# Patient Record
Sex: Male | Born: 1975 | Race: White | Hispanic: No | Marital: Married | Smoking: Former smoker
Health system: Southern US, Community
[De-identification: ages and names within clinical notes are randomized; demographics above are authoritative.]

---

## 1999-03-01 ENCOUNTER — Emergency Department (HOSPITAL_COMMUNITY): Admission: EM | Admit: 1999-03-01 | Discharge: 1999-03-02 | Payer: Self-pay | Admitting: *Deleted

## 2010-07-23 ENCOUNTER — Other Ambulatory Visit: Payer: Self-pay | Admitting: Neurosurgery

## 2010-07-23 DIAGNOSIS — M542 Cervicalgia: Secondary | ICD-10-CM

## 2010-07-23 DIAGNOSIS — M79601 Pain in right arm: Secondary | ICD-10-CM

## 2010-07-25 ENCOUNTER — Ambulatory Visit
Admission: RE | Admit: 2010-07-25 | Discharge: 2010-07-25 | Disposition: A | Discharge status: Home / Self Care | Payer: BC Managed Care – PPO | Source: Ambulatory Visit | Attending: Neurosurgery | Admitting: Neurosurgery

## 2010-07-25 DIAGNOSIS — M79601 Pain in right arm: Secondary | ICD-10-CM

## 2010-07-25 DIAGNOSIS — M542 Cervicalgia: Secondary | ICD-10-CM

## 2010-09-19 ENCOUNTER — Other Ambulatory Visit: Payer: Self-pay | Admitting: Neurosurgery

## 2010-09-19 DIAGNOSIS — M542 Cervicalgia: Secondary | ICD-10-CM

## 2010-09-19 DIAGNOSIS — M79601 Pain in right arm: Secondary | ICD-10-CM

## 2010-09-20 ENCOUNTER — Ambulatory Visit
Admission: RE | Admit: 2010-09-20 | Discharge: 2010-09-20 | Disposition: A | Discharge status: Home / Self Care | Payer: BC Managed Care – PPO | Source: Ambulatory Visit | Attending: Neurosurgery | Admitting: Neurosurgery

## 2010-09-20 DIAGNOSIS — M79601 Pain in right arm: Secondary | ICD-10-CM

## 2010-09-20 DIAGNOSIS — M542 Cervicalgia: Secondary | ICD-10-CM

## 2011-03-11 ENCOUNTER — Other Ambulatory Visit: Payer: Self-pay | Admitting: Neurosurgery

## 2011-03-11 DIAGNOSIS — M542 Cervicalgia: Secondary | ICD-10-CM

## 2011-03-11 DIAGNOSIS — M5020 Other cervical disc displacement, unspecified cervical region: Secondary | ICD-10-CM

## 2011-03-13 ENCOUNTER — Ambulatory Visit
Admission: RE | Admit: 2011-03-13 | Discharge: 2011-03-13 | Disposition: A | Discharge status: Home / Self Care | Payer: BC Managed Care – PPO | Source: Ambulatory Visit | Attending: Neurosurgery | Admitting: Neurosurgery

## 2011-03-13 DIAGNOSIS — M542 Cervicalgia: Secondary | ICD-10-CM

## 2011-03-13 DIAGNOSIS — M5020 Other cervical disc displacement, unspecified cervical region: Secondary | ICD-10-CM

## 2011-03-13 MED ORDER — TRIAMCINOLONE ACETONIDE 40 MG/ML IJ SUSP (RADIOLOGY)
60.0000 mg | Freq: Once | INTRAMUSCULAR | Status: AC
  Administered 2011-03-13: 60 mg via EPIDURAL

## 2011-03-13 MED ORDER — IOHEXOL 300 MG/ML  SOLN
1.0000 mL | Freq: Once | INTRAMUSCULAR | Status: AC | PRN
  Administered 2011-03-13: 1 mL via EPIDURAL

## 2011-08-14 ENCOUNTER — Other Ambulatory Visit: Payer: Self-pay | Admitting: Neurosurgery

## 2011-08-14 DIAGNOSIS — M5020 Other cervical disc displacement, unspecified cervical region: Secondary | ICD-10-CM

## 2011-08-21 ENCOUNTER — Ambulatory Visit
Admission: RE | Admit: 2011-08-21 | Discharge: 2011-08-21 | Disposition: A | Discharge status: Home / Self Care | Payer: BC Managed Care – PPO | Source: Ambulatory Visit | Attending: Neurosurgery | Admitting: Neurosurgery

## 2011-08-21 DIAGNOSIS — M5020 Other cervical disc displacement, unspecified cervical region: Secondary | ICD-10-CM

## 2011-08-21 MED ORDER — TRIAMCINOLONE ACETONIDE 40 MG/ML IJ SUSP (RADIOLOGY)
60.0000 mg | Freq: Once | INTRAMUSCULAR | Status: AC
  Administered 2011-08-21: 60 mg via EPIDURAL

## 2011-08-21 MED ORDER — IOHEXOL 300 MG/ML  SOLN
1.0000 mL | Freq: Once | INTRAMUSCULAR | Status: AC | PRN
  Administered 2011-08-21: 1 mL via EPIDURAL

## 2011-08-21 NOTE — Discharge Instructions (Signed)

## 2014-08-26 DIAGNOSIS — M5090 Cervical disc disorder, unspecified, unspecified cervical region: Secondary | ICD-10-CM | POA: Insufficient documentation

## 2014-09-09 DIAGNOSIS — J301 Allergic rhinitis due to pollen: Secondary | ICD-10-CM | POA: Insufficient documentation

## 2014-09-09 DIAGNOSIS — E785 Hyperlipidemia, unspecified: Secondary | ICD-10-CM | POA: Insufficient documentation

## 2014-10-07 ENCOUNTER — Other Ambulatory Visit: Payer: Self-pay | Admitting: Neurosurgery

## 2014-10-07 DIAGNOSIS — M542 Cervicalgia: Secondary | ICD-10-CM

## 2014-10-12 ENCOUNTER — Ambulatory Visit
Admission: RE | Admit: 2014-10-12 | Discharge: 2014-10-12 | Disposition: A | Discharge status: Home / Self Care | Payer: BLUE CROSS/BLUE SHIELD | Source: Ambulatory Visit | Attending: Neurosurgery | Admitting: Neurosurgery

## 2014-10-12 ENCOUNTER — Other Ambulatory Visit: Payer: Self-pay

## 2014-10-12 DIAGNOSIS — M542 Cervicalgia: Secondary | ICD-10-CM

## 2014-10-12 MED ORDER — TRIAMCINOLONE ACETONIDE 40 MG/ML IJ SUSP (RADIOLOGY)
60.0000 mg | Freq: Once | INTRAMUSCULAR | Status: AC
  Administered 2014-10-12: 60 mg via EPIDURAL

## 2014-10-12 MED ORDER — IOHEXOL 300 MG/ML  SOLN
1.0000 mL | Freq: Once | INTRAMUSCULAR | Status: AC | PRN
  Administered 2014-10-12: 1 mL via EPIDURAL

## 2014-10-12 NOTE — Discharge Instructions (Signed)

## 2015-03-29 ENCOUNTER — Other Ambulatory Visit: Payer: Self-pay | Admitting: Neurosurgery

## 2015-03-29 DIAGNOSIS — M5020 Other cervical disc displacement, unspecified cervical region: Secondary | ICD-10-CM

## 2015-04-06 ENCOUNTER — Ambulatory Visit
Admission: RE | Admit: 2015-04-06 | Discharge: 2015-04-06 | Disposition: A | Discharge status: Home / Self Care | Payer: BLUE CROSS/BLUE SHIELD | Source: Ambulatory Visit | Attending: Neurosurgery | Admitting: Neurosurgery

## 2015-04-06 DIAGNOSIS — M5020 Other cervical disc displacement, unspecified cervical region: Secondary | ICD-10-CM

## 2015-04-06 MED ORDER — TRIAMCINOLONE ACETONIDE 40 MG/ML IJ SUSP (RADIOLOGY)
60.0000 mg | Freq: Once | INTRAMUSCULAR | Status: AC
  Administered 2015-04-06: 60 mg via EPIDURAL

## 2015-04-06 MED ORDER — IOHEXOL 300 MG/ML  SOLN
1.0000 mL | Freq: Once | INTRAMUSCULAR | Status: AC | PRN
  Administered 2015-04-06: 1 mL via EPIDURAL

## 2016-03-06 ENCOUNTER — Other Ambulatory Visit: Payer: Self-pay | Admitting: Nurse Practitioner

## 2016-03-06 DIAGNOSIS — M5020 Other cervical disc displacement, unspecified cervical region: Secondary | ICD-10-CM

## 2016-03-11 ENCOUNTER — Ambulatory Visit
Admission: RE | Admit: 2016-03-11 | Discharge: 2016-03-11 | Disposition: A | Discharge status: Home / Self Care | Payer: BLUE CROSS/BLUE SHIELD | Source: Ambulatory Visit | Attending: Nurse Practitioner | Admitting: Nurse Practitioner

## 2016-03-11 DIAGNOSIS — M5020 Other cervical disc displacement, unspecified cervical region: Secondary | ICD-10-CM

## 2016-03-11 MED ORDER — TRIAMCINOLONE ACETONIDE 40 MG/ML IJ SUSP (RADIOLOGY)
60.0000 mg | Freq: Once | INTRAMUSCULAR | Status: AC
  Administered 2016-03-11: 60 mg via EPIDURAL

## 2016-03-11 MED ORDER — IOPAMIDOL (ISOVUE-M 300) INJECTION 61%
1.0000 mL | Freq: Once | INTRAMUSCULAR | Status: AC | PRN
  Administered 2016-03-11: 1 mL via EPIDURAL

## 2016-03-11 NOTE — Discharge Instructions (Signed)

## 2024-02-19 NOTE — Progress Notes (Signed)
 Subjective   Patient ID:  Derrick Best is a 48 y.o. (DOB 1975-08-02) male    Patient presents with   Annual Exam     Presents today for a complete physical.   Additional concerns:  History of Present Illness The patient is a 48 year old male presenting for a wellness visit. He is on bupropion for anxiety, Zetia, and over-the-counter Flonase, all without adverse effects. He has a history of myalgias and constipation with statins. He has not tried red yeast rice. There have been no hospitalizations in the past year and no new family history. He experiences occasional nocturia after drinking coffee but has no blood in his stool or peripheral edema. He reports occasional postprandial chest tightness that is relieved by burping and is not exertional. He does not have a cough after eating or at night. The patient quit smoking on November 12, 2020, having started at age 35 with an intermittent history and varying quantity. He does not engage in regular exercise due to work commitments and never consumes more than 14 alcoholic drinks per week. He has a history of shingles in the eye around 2016-2017, with no vision loss, and was referred to an optometrist. There was no facial rash or scab on the nose. He noticed a spot under his tongue post-vaccination, which has since resolved. He attends regular dental check-ups but missed his last appointment.  FAMILY HISTORY - No family history of prostate cancer or kidney disease - Granddad had heart attack and stroke due to medication allergy - Mother's side has high cholesterol - No family history of early coronary artery disease or colon cancer - Family history of polyps  Review of Systems   Objective  BP 120/74 (BP Location: Right Upper Arm, Patient Position: Sitting)   Pulse 76   Temp 97.3 F (36.3 C) (Temporal)   Ht 6' (1.829 m)   Wt 186 lb 6.4 oz (84.6 kg)   SpO2 98%   BMI 25.28 kg/m    Physical Exam Vitals reviewed.  HENT:     Head:  Normocephalic and atraumatic.     Right Ear: Tympanic membrane, ear canal and external ear normal.     Left Ear: Tympanic membrane, ear canal and external ear normal.  Eyes:     General: No scleral icterus. Neck:     Vascular: No carotid bruit.  Cardiovascular:     Rate and Rhythm: Normal rate and regular rhythm.  Musculoskeletal:     Right lower leg: No edema.     Left lower leg: No edema.  Pulmonary:     Effort: Pulmonary effort is normal.     Breath sounds: Normal breath sounds.  Abdominal:     Palpations: Abdomen is soft.  Lymphadenopathy:     Cervical: No cervical adenopathy.  Skin:    General: Skin is warm and dry.     Findings: No rash.  Neurological:     General: No focal deficit present.     Mental Status: He is alert and oriented to person, place, and time.  Psychiatric:        Behavior: Behavior normal.        Thought Content: Thought content normal.        Judgment: Judgment normal.     Lab Results  Component Value Date   Cholesterol, Total 240 (H) 10/15/2023   Lab Results  Component Value Date   HDL 43 10/15/2023   Lab Results  Component Value Date   LDL 145 (H)  10/15/2023   Lab Results  Component Value Date   Triglycerides 286 (H) 10/15/2023   No results found for: [facility] Lab Results  Component Value Date   PSA 1.4 10/15/2023     Assessment and Plan     ICD-10-CM   1. Adult wellness visit  Z00.00 Lipid Panel With LDL/HDL Ratio    Basic metabolic panel    Hemoglobin A1c    Hemoglobin A1c    Basic metabolic panel    Lipid Panel With LDL/HDL Ratio    2. Statin intolerance  Z78.9     3. Hyperlipidemia, unspecified hyperlipidemia type  E78.5 ezetimibe (ZETIA) 10 MG tablet    Lipid Panel With LDL/HDL Ratio    Hemoglobin A1c    Hemoglobin A1c    Lipid Panel With LDL/HDL Ratio    4. Generalized anxiety disorder  F41.1 buPROPion hcl (WELLBUTRIN XL) 300 mg 24 hr tablet    5. Hyperlipidemia, unspecified hyperlipidemia type  E78.5  ezetimibe (ZETIA) 10 MG tablet    Lipid Panel With LDL/HDL Ratio    Hemoglobin A1c    Hemoglobin A1c    Lipid Panel With LDL/HDL Ratio   Statin intolerant, treated with Zetia.      Assessment & Plan 1. Wellness visit: - PSA normal few months ago, no immediate retesting needed -ASCVD Risk score 4.7% - BMI 25 - Blood pressure 120/74 today, previously 119/76- WNL.  - Does not meet risk criteria for lung CA screening - No risk for hepatitis C screening - Shingles vaccine prescription to pharmacy - Colonoscopy up-to-date - Order metabolic panel - Advise 150 minutes of physical activity per week - Limit alcohol to 2 drinks per day or less.  - Wear SPF long sleeves, hat, and mineral sunscreen for skin cancer prevention  2. Anxiety: - On bupropion (Wellbutrin), no side effects, condition chronic and stable.  - Refill prescription to pharmacy  3. Hypercholesterolemia: - On Zetia, no side effects - Refill prescription to pharmacy  Follow-up: - In six months  Colon cancer screening: UTD  Immunizations: Send shingles script to pharmacy   Hep C screening: No risk factors  Lipid screening: ordered  Current statin use: No, on Zetia.   Blood pressure screening: Performed, see VS.   Current BP med use: None  Recommend increase regular exercise to 150 minutes weekly.   Mood screening:  Current use of medications: Wellbutrin refilled.   Diabetes screening: Ordered  Alcohol Use screening: Negative  Laymon KATHEE Ancona, DNP   Computer technology was used to create visit note. Consent from patient/caregiver was obtained prior to it's use.

## 2024-04-14 NOTE — Telephone Encounter (Signed)
 Pharmacy requests RX. ERX sent

## 2024-04-19 ENCOUNTER — Other Ambulatory Visit: Payer: Self-pay

## 2024-04-19 ENCOUNTER — Encounter (HOSPITAL_COMMUNITY): Payer: Self-pay | Admitting: Orthopedic Surgery

## 2024-04-19 ENCOUNTER — Ambulatory Visit
Admission: RE | Admit: 2024-04-19 | Discharge: 2024-04-19 | Disposition: A | Discharge status: Home / Self Care | Source: Ambulatory Visit | Attending: Orthopedic Surgery | Admitting: Orthopedic Surgery

## 2024-04-19 ENCOUNTER — Other Ambulatory Visit: Payer: Self-pay | Admitting: Orthopedic Surgery

## 2024-04-19 DIAGNOSIS — S82122D Displaced fracture of lateral condyle of left tibia, subsequent encounter for closed fracture with routine healing: Secondary | ICD-10-CM

## 2024-04-19 NOTE — H&P (Signed)
 PREOPERATIVE H&P  Chief Complaint: LEFT UNICONDYLAR TIBIA PLATEAU FRACTURE  HPI: Derrick Best is a 48 y.o. male who presents with a diagnosis of LEFT UNICONDYLAR TIBIA PLATEAU FRACTURE. Symptoms are rated as moderate to severe, and have been worsening.  This is significantly impairing activities of daily living.  He has elected for surgical management.   Past Medical History:  Diagnosis Date   Anxiety    Past Surgical History:  Procedure Laterality Date   shoulder surgery Right    rotator cuff repair   Social History   Socioeconomic History   Marital status: Married    Spouse name: Not on file   Number of children: Not on file   Years of education: Not on file   Highest education level: Not on file  Occupational History   Not on file  Tobacco Use   Smoking status: Former    Current packs/day: 0.00    Average packs/day: 0.1 packs/day for 2.0 years (0.2 ttl pk-yrs)    Types: Cigarettes    Start date: 10/12/1994    Quit date: 2022    Years since quitting: 3.9   Smokeless tobacco: Never  Vaping Use   Vaping status: Never Used  Substance and Sexual Activity   Alcohol use: Yes    Comment: occas   Drug use: Never   Sexual activity: Not on file  Other Topics Concern   Not on file  Social History Narrative   Not on file   Social Drivers of Health   Tobacco Use: Medium Risk (04/19/2024)   Patient History    Smoking Tobacco Use: Former    Smokeless Tobacco Use: Never    Passive Exposure: Not on file  Financial Resource Strain: Low Risk (02/19/2024)   Received from [facility]   Overall Financial Resource Strain (CARDIA)    How hard is it for you to pay for the very basics like food, housing, medical care, and heating?: Not hard at all  Food Insecurity: No Food Insecurity (02/19/2024)   Received from [facility]   Epic    Within the past 12 months, you worried that your food would run out before you got the money to buy more.: Never true    Within the past 12  months, the food you bought just didn't last and you didn't have money to get more.: Never true  Transportation Needs: No Transportation Needs (02/19/2024)   Received from [facility]    In the past 12 months, has lack of transportation kept you from medical appointments or from getting medications?: No    In the past 12 months, has lack of transportation kept you from meetings, work, or from getting things needed for daily living?: No  Physical Activity: Inactive (02/19/2024)   Received from [facility]   Exercise Vital Sign    On average, how many days per week do you engage in moderate to strenuous exercise (like a brisk walk)?: 0 days    Minutes of Exercise per Session: Not on file  Stress: No Stress Concern Present (02/19/2024)   Received from [facility] Occup[facility] - Occupational Stress Questionnaire    Do you feel stress - tense, restless, nervous, or anxious, or unable to sleep at night because your mind is troubled all the time - these days?: Not at all  Social Connections: Socially Integrated (02/19/2024)   Received from [facility]   Social Network    How would you rate  your social network (family, work, friends)?: Good participation with social networks  Depression (PHQ2-9): Not on file  Alcohol Screen: Not on file  Housing: Low Risk (02/19/2024)   Received from [facility]    In the last 12 months, was there a time when you were not able to pay the mortgage or rent on time?: No    In the past 12 months, how many times have you moved where you were living?: 1    At any time in the past 12 months, were you homeless or living in a shelter (including now)?: No  Utilities: Not At Risk (02/19/2024)   Received from [facility]    In the past 12 months has the electric, gas, oil, or water company threatened to shut off services in your home?: No  Health Literacy: Not on file   History reviewed. No pertinent family  history. Allergies[1] Prior to Admission medications  Medication Sig Start Date End Date Taking? Authorizing Provider  ezetimibe (ZETIA) 10 MG tablet Take 10 mg by mouth. 09/12/14 09/12/15  [provider]  fluticasone OREN) 50 MCG/ACT nasal spray Use 2 sprays each nostril once daily as needed for allergies 09/09/14   [provider]     Positive ROS: All other systems have been reviewed and were otherwise negative with the exception of those mentioned in the HPI and as above.  Physical Exam: General: Alert, no acute distress Cardiovascular: No pedal edema Respiratory: No cyanosis, no use of accessory musculature GI: No organomegaly, abdomen is soft and non-tender Skin: No lesions in the area of chief complaint Neurologic: Sensation intact distally Psychiatric: Patient is competent for consent with normal mood and affect Lymphatic: No axillary or cervical lymphadenopathy  MUSCULOSKELETAL: TTP left knee, limited ROM tolerated, effusion present, NVI   Imaging: xrays show a displaced, depressed lateral tibial plateau fracture   Assessment: LEFT UNICONDYLAR TIBIA PLATEAU FRACTURE  Plan: Plan for Procedures: OPEN REDUCTION INTERNAL FIXATION (ORIF) TIBIAL PLATEAU REPAIR, MENISCUS, KNEE  The risks benefits and alternatives were discussed with the patient including but not limited to the risks of nonoperative treatment, versus surgical intervention including infection, bleeding, nerve injury,  blood clots, cardiopulmonary complications, morbidity, mortality, among others, and they were willing to proceed.   Weightbearing: NWB LLE Orthopedic devices: KI Showering: POD 3 Dressing: reinforce PRN Medicines: ASA, Oxy, Tylenol , Mobic , Baclofen , Zofran   Discharge: home Follow up: 05/03/24 at 11am with me    Gerard CHRISTELLA Ted DEVONNA Office [phone] 04/19/2024 4:57 PM        [1]  Allergies Allergen Reactions   Colesevelam Other (See Comments)     Constipation   Statins Other (See Comments)    Myalgias

## 2024-04-19 NOTE — Progress Notes (Signed)
 SDW call  Patient was given pre-op instructions over the phone. Patient verbalized understanding of instructions provided.  Denies any SOB, fever or cough   PCP - [facility] Cardiologist -  Pulmonary:    PPM/ICD - denies Device Orders - na Rep Notified - na   Chest x-ray -  EKG -   Stress Test - ECHO -  Cardiac Cath -   Sleep Study/sleep apnea/CPAP: denies  Non-diabetic  Blood Thinner Instructions: denies Aspirin  Instructions:denies   ERAS Protcol - Clears until 1100   Anesthesia review: No  Your procedure is scheduled on Tuesday April 20, 2024  Report to [facility] Main Entrance A at   1130 A.M., then check in with the Admitting office.  Call this number if you have problems the morning of surgery:  [phone]   If you have any questions prior to your surgery date call [phone]: Open Monday-Friday 8am-4pm If you experience any cold or flu symptoms such as cough, fever, chills, shortness of breath, etc. between now and your scheduled surgery, please notify us  at the above number     Remember:  Do not eat after midnight the night before your surgery  You may drink clear liquids until  1100   the morning of your surgery.   Clear liquids allowed are: Water, Non-Citrus Juices (without pulp), Carbonated Beverages, Clear Tea, Black Coffee ONLY (NO MILK, CREAM OR POWDERED CREAMER of any kind), and Gatorade   Take these medicines the morning of surgery with A SIP OF WATER:  Wellbutrin, zetia, flonase  As needed: Norco, Saline nasal  As of today, STOP taking any Aspirin  (unless otherwise instructed by your surgeon) Aleve, Naproxen, Ibuprofen, Motrin, Advil, Goody's, BC's, all herbal medications, fish oil, and all vitamins.

## 2024-04-20 ENCOUNTER — Ambulatory Visit (HOSPITAL_COMMUNITY)

## 2024-04-20 ENCOUNTER — Ambulatory Visit (HOSPITAL_COMMUNITY)
Admission: RE | Admit: 2024-04-20 | Discharge: 2024-04-20 | Disposition: A | Discharge status: Home / Self Care | Attending: Orthopedic Surgery | Admitting: Orthopedic Surgery

## 2024-04-20 ENCOUNTER — Ambulatory Visit (HOSPITAL_COMMUNITY): Admitting: Anesthesiology

## 2024-04-20 ENCOUNTER — Encounter: Admission: RE | Disposition: A | Payer: Self-pay | Attending: Orthopedic Surgery

## 2024-04-20 ENCOUNTER — Encounter (HOSPITAL_COMMUNITY): Payer: Self-pay | Admitting: Orthopedic Surgery

## 2024-04-20 DIAGNOSIS — Z87891 Personal history of nicotine dependence: Secondary | ICD-10-CM | POA: Diagnosis not present

## 2024-04-20 DIAGNOSIS — X58XXXA Exposure to other specified factors, initial encounter: Secondary | ICD-10-CM | POA: Diagnosis not present

## 2024-04-20 DIAGNOSIS — S83282A Other tear of lateral meniscus, current injury, left knee, initial encounter: Secondary | ICD-10-CM | POA: Diagnosis not present

## 2024-04-20 DIAGNOSIS — S82142A Displaced bicondylar fracture of left tibia, initial encounter for closed fracture: Secondary | ICD-10-CM | POA: Diagnosis present

## 2024-04-20 HISTORY — PX: ORIF TIBIA FRACTURE: SHX5416

## 2024-04-20 HISTORY — PX: MENISCUS REPAIR: SHX5179

## 2024-04-20 SURGERY — OPEN REDUCTION INTERNAL FIXATION (ORIF) TIBIA FRACTURE
Anesthesia: Regional | Site: Knee | Laterality: Left

## 2024-04-20 MED ORDER — LACTATED RINGERS IV SOLN: INTRAVENOUS | Status: DC

## 2024-04-20 MED ORDER — ORAL CARE MOUTH RINSE: 15.0000 mL | Freq: Once | OROMUCOSAL | Status: AC

## 2024-04-20 MED ORDER — ROPIVACAINE HCL 5 MG/ML IJ SOLN
INTRAMUSCULAR | Status: DC | PRN
  Administered 2024-04-20: 12:00:00 20 mL via PERINEURAL

## 2024-04-20 MED ORDER — MIDAZOLAM HCL 2 MG/2ML IJ SOLN
INTRAMUSCULAR | Status: AC
  Filled 2024-04-20: qty 2

## 2024-04-20 MED ORDER — MIDAZOLAM HCL (PF) 2 MG/2ML IJ SOLN: 2.0000 mg | Freq: Once | INTRAMUSCULAR | Status: AC

## 2024-04-20 MED ORDER — POVIDONE-IODINE 10 % EX SWAB
2.0000 | Freq: Once | CUTANEOUS | Status: AC
  Administered 2024-04-20: 12:00:00 2 via TOPICAL

## 2024-04-20 MED ORDER — MIDAZOLAM HCL 2 MG/2ML IJ SOLN
INTRAMUSCULAR | Status: AC
  Administered 2024-04-20: 12:00:00 2 mg via INTRAVENOUS
  Filled 2024-04-20: qty 2

## 2024-04-20 MED ORDER — FENTANYL CITRATE (PF) 100 MCG/2ML IJ SOLN: 50.0000 ug | Freq: Once | INTRAMUSCULAR | Status: AC

## 2024-04-20 MED ORDER — ROCURONIUM BROMIDE 10 MG/ML (PF) SYRINGE
PREFILLED_SYRINGE | INTRAVENOUS | Status: DC | PRN
  Administered 2024-04-20: 14:00:00 30 mg via INTRAVENOUS
  Administered 2024-04-20: 14:00:00 60 mg via INTRAVENOUS

## 2024-04-20 MED ORDER — ACETAMINOPHEN 500 MG PO TABS
1000.0000 mg | ORAL_TABLET | Freq: Once | ORAL | Status: AC
  Administered 2024-04-20: 12:00:00 1000 mg via ORAL
  Filled 2024-04-20: qty 2

## 2024-04-20 MED ORDER — PROPOFOL 10 MG/ML IV BOLUS
INTRAVENOUS | Status: DC | PRN
  Administered 2024-04-20: 14:00:00 120 mg via INTRAVENOUS

## 2024-04-20 MED ORDER — FENTANYL CITRATE (PF) 250 MCG/5ML IJ SOLN
INTRAMUSCULAR | Status: DC | PRN
  Administered 2024-04-20 (×3): 50 ug via INTRAVENOUS
  Administered 2024-04-20: 14:00:00 100 ug via INTRAVENOUS

## 2024-04-20 MED ORDER — CEFAZOLIN SODIUM-DEXTROSE 2-4 GM/100ML-% IV SOLN
2.0000 g | INTRAVENOUS | Status: AC
  Administered 2024-04-20: 14:00:00 2 g via INTRAVENOUS
  Filled 2024-04-20: qty 100

## 2024-04-20 MED ORDER — BACITRACIN ZINC 500 UNIT/GM EX OINT
TOPICAL_OINTMENT | CUTANEOUS | Status: AC
  Filled 2024-04-20: qty 28.35

## 2024-04-20 MED ORDER — ONDANSETRON HCL 4 MG/2ML IJ SOLN
INTRAMUSCULAR | Status: DC | PRN
  Administered 2024-04-20: 15:00:00 4 mg via INTRAVENOUS

## 2024-04-20 MED ORDER — DEXAMETHASONE SOD PHOSPHATE PF 10 MG/ML IJ SOLN
INTRAMUSCULAR | Status: DC | PRN
  Administered 2024-04-20: 14:00:00 10 mg via INTRAVENOUS

## 2024-04-20 MED ORDER — ONDANSETRON HCL 4 MG/2ML IJ SOLN
INTRAMUSCULAR | Status: AC
  Filled 2024-04-20: qty 2

## 2024-04-20 MED ORDER — MIDAZOLAM HCL (PF) 2 MG/2ML IJ SOLN
INTRAMUSCULAR | Status: DC | PRN
  Administered 2024-04-20: 13:00:00 2 mg via INTRAVENOUS

## 2024-04-20 MED ORDER — ONDANSETRON HCL 4 MG/2ML IJ SOLN: 4.0000 mg | Freq: Once | INTRAMUSCULAR | Status: DC | PRN

## 2024-04-20 MED ORDER — BACLOFEN 10 MG PO TABS: 10.0000 mg | ORAL_TABLET | Freq: Three times a day (TID) | ORAL | 0 refills | Status: AC | PRN

## 2024-04-20 MED ORDER — ONDANSETRON 4 MG PO TBDP: 4.0000 mg | ORAL_TABLET | Freq: Three times a day (TID) | ORAL | 0 refills | Status: AC | PRN

## 2024-04-20 MED ORDER — SENNA-DOCUSATE SODIUM 8.6-50 MG PO TABS: 2.0000 | ORAL_TABLET | Freq: Every day | ORAL | 1 refills | Status: AC | PRN

## 2024-04-20 MED ORDER — ACETAMINOPHEN 500 MG PO TABS: 1000.0000 mg | ORAL_TABLET | Freq: Four times a day (QID) | ORAL | 0 refills | Status: AC | PRN

## 2024-04-20 MED ORDER — FENTANYL CITRATE (PF) 250 MCG/5ML IJ SOLN
INTRAMUSCULAR | Status: AC
  Filled 2024-04-20: qty 5

## 2024-04-20 MED ORDER — MELOXICAM 15 MG PO TABS: 15.0000 mg | ORAL_TABLET | Freq: Every day | ORAL | 0 refills | Status: AC | PRN

## 2024-04-20 MED ORDER — ASPIRIN 81 MG PO TBEC: 81.0000 mg | DELAYED_RELEASE_TABLET | Freq: Two times a day (BID) | ORAL | 0 refills | Status: AC

## 2024-04-20 MED ORDER — DEXMEDETOMIDINE HCL IN NACL 80 MCG/20ML IV SOLN
INTRAVENOUS | Status: AC
  Filled 2024-04-20: qty 20

## 2024-04-20 MED ORDER — FENTANYL CITRATE (PF) 100 MCG/2ML IJ SOLN: 25.0000 ug | INTRAMUSCULAR | Status: DC | PRN

## 2024-04-20 MED ORDER — DEXMEDETOMIDINE HCL IN NACL 80 MCG/20ML IV SOLN
INTRAVENOUS | Status: DC | PRN
  Administered 2024-04-20: 15:00:00 10 ug via INTRAVENOUS

## 2024-04-20 MED ORDER — OXYCODONE HCL 5 MG/5ML PO SOLN: 5.0000 mg | Freq: Once | ORAL | Status: DC | PRN

## 2024-04-20 MED ORDER — LIDOCAINE 2% (20 MG/ML) 5 ML SYRINGE
INTRAMUSCULAR | Status: DC | PRN
  Administered 2024-04-20: 14:00:00 100 mg via INTRAVENOUS

## 2024-04-20 MED ORDER — FENTANYL CITRATE (PF) 100 MCG/2ML IJ SOLN
INTRAMUSCULAR | Status: AC
  Administered 2024-04-20: 12:00:00 50 ug via INTRAVENOUS
  Filled 2024-04-20: qty 2

## 2024-04-20 MED ORDER — PROPOFOL 10 MG/ML IV BOLUS
INTRAVENOUS | Status: AC
  Filled 2024-04-20: qty 20

## 2024-04-20 MED ORDER — ACETAMINOPHEN 10 MG/ML IV SOLN: 1000.0000 mg | Freq: Once | INTRAVENOUS | Status: DC | PRN

## 2024-04-20 MED ORDER — 0.9 % SODIUM CHLORIDE (POUR BTL) OPTIME
TOPICAL | Status: DC | PRN
  Administered 2024-04-20: 14:00:00 1000 mL

## 2024-04-20 MED ORDER — LIDOCAINE 2% (20 MG/ML) 5 ML SYRINGE
INTRAMUSCULAR | Status: AC
  Filled 2024-04-20: qty 5

## 2024-04-20 MED ORDER — CHLORHEXIDINE GLUCONATE 0.12 % MT SOLN
15.0000 mL | Freq: Once | OROMUCOSAL | Status: AC
  Administered 2024-04-20: 12:00:00 15 mL via OROMUCOSAL
  Filled 2024-04-20: qty 15

## 2024-04-20 MED ORDER — OXYCODONE HCL 5 MG PO TABS: 5.0000 mg | ORAL_TABLET | Freq: Once | ORAL | Status: DC | PRN

## 2024-04-20 MED ORDER — SUGAMMADEX SODIUM 200 MG/2ML IV SOLN
INTRAVENOUS | Status: DC | PRN
  Administered 2024-04-20 (×2): 100 mg via INTRAVENOUS

## 2024-04-20 MED ORDER — DOUBLE ANTIBIOTIC 500-10000 UNIT/GM EX OINT
TOPICAL_OINTMENT | CUTANEOUS | Status: AC
  Filled 2024-04-20: qty 28.4

## 2024-04-20 MED ORDER — TRANEXAMIC ACID-NACL 1000-0.7 MG/100ML-% IV SOLN
1000.0000 mg | INTRAVENOUS | Status: AC
  Administered 2024-04-20: 14:00:00 1000 mg via INTRAVENOUS
  Filled 2024-04-20: qty 100

## 2024-04-20 SURGICAL SUPPLY — 74 items
3.5 x 70 locking screw IMPLANT
3.5x60 Locking Screw IMPLANT
4.0x80 cancellous Screw IMPLANT
BAG COUNTER SPONGE SURGICOUNT (BAG) ×2 IMPLANT
BENZOIN TINCTURE PRP APPL 2/3 (GAUZE/BANDAGES/DRESSINGS) IMPLANT
BIT DRILL MIS AO 2.5X215 (BIT) IMPLANT
BIT DRILL PANGEA 3.5X135 (BIT) IMPLANT
BLADE SURG 10 STRL SS (BLADE) ×2 IMPLANT
BLADE SURG 15 STRL LF DISP TIS (BLADE) ×2 IMPLANT
BNDG COHESIVE 4X5 TAN STRL LF (GAUZE/BANDAGES/DRESSINGS) ×2 IMPLANT
BNDG COHESIVE 6X5 TAN ST LF (GAUZE/BANDAGES/DRESSINGS) IMPLANT
BNDG COMPR ESMARK 4X3 LF (GAUZE/BANDAGES/DRESSINGS) IMPLANT
BNDG COMPR ESMARK 6X3 LF (GAUZE/BANDAGES/DRESSINGS) ×2 IMPLANT
BNDG ELASTIC 4INX 5YD STR LF (GAUZE/BANDAGES/DRESSINGS) IMPLANT
BNDG ELASTIC 4X5.8 VLCR STR LF (GAUZE/BANDAGES/DRESSINGS) IMPLANT
BNDG ELASTIC 6INX 5YD STR LF (GAUZE/BANDAGES/DRESSINGS) IMPLANT
BNDG GAUZE DERMACEA FLUFF 4 (GAUZE/BANDAGES/DRESSINGS) ×2 IMPLANT
COVER MAYO STAND STRL (DRAPES) ×2 IMPLANT
COVER SURGICAL LIGHT HANDLE (MISCELLANEOUS) ×2 IMPLANT
CUFF TRNQT CYL 34X4.125X (TOURNIQUET CUFF) IMPLANT
DRAPE C-ARM 42X72 X-RAY (DRAPES) ×2 IMPLANT
DRAPE C-ARMOR (DRAPES) IMPLANT
DRAPE IMP U-DRAPE 54X76 (DRAPES) ×4 IMPLANT
DRAPE INCISE IOBAN 66X45 STRL (DRAPES) ×2 IMPLANT
DRAPE U-SHAPE 47X51 STRL (DRAPES) ×2 IMPLANT
ELECTRODE REM PT RTRN 9FT ADLT (ELECTROSURGICAL) ×2 IMPLANT
GAUZE PAD ABD 8X10 STRL (GAUZE/BANDAGES/DRESSINGS) ×8 IMPLANT
GAUZE SPONGE 4X4 12PLY STRL (GAUZE/BANDAGES/DRESSINGS) ×2 IMPLANT
GAUZE XEROFORM 5X9 LF (GAUZE/BANDAGES/DRESSINGS) IMPLANT
GLOVE BIO SURGEON STRL SZ7.5 (GLOVE) ×2 IMPLANT
GLOVE BIO SURGEON STRL SZ8 (GLOVE) IMPLANT
GLOVE BIOGEL PI IND STRL 7.0 (GLOVE) IMPLANT
GLOVE BIOGEL PI IND STRL 7.5 (GLOVE) ×2 IMPLANT
GLOVE BIOGEL PI IND STRL 8 (GLOVE) ×2 IMPLANT
GLOVE SURG SS PI 7.0 STRL IVOR (GLOVE) IMPLANT
GLOVE SURG SYN 7.5 PF PI (GLOVE) ×2 IMPLANT
GOWN STRL REUS W/ TWL LRG LVL3 (GOWN DISPOSABLE) ×4 IMPLANT
GOWN STRL REUS W/ TWL XL LVL3 (GOWN DISPOSABLE) ×4 IMPLANT
IMMOBILIZER KNEE 22 UNIV (SOFTGOODS) IMPLANT
KIT BASIN OR (CUSTOM PROCEDURE TRAY) ×2 IMPLANT
KIT TURNOVER KIT B (KITS) ×2 IMPLANT
KWIRE SMOOTH 2.0X150 (WIRE) IMPLANT
Left Proximal Tibia Plate IMPLANT
MANIFOLD NEPTUNE II (INSTRUMENTS) ×2 IMPLANT
NDL SUT 6 .5 CRC .975X.05 MAYO (NEEDLE) IMPLANT
PACK ORTHO EXTREMITY (CUSTOM PROCEDURE TRAY) ×2 IMPLANT
PAD ARMBOARD POSITIONER FOAM (MISCELLANEOUS) ×4 IMPLANT
PLATE TIB PROX 3.5X95 2H LT (Plate) IMPLANT
PUTTY BONE HEMOSTAT MONTAGE 5C (Putty) IMPLANT
PUTTY DBX 5CC (Putty) IMPLANT
SCREW BONE 36X3.5MM (Screw) IMPLANT
SCREW BONE 38X3.5MM (Screw) IMPLANT
SCREW CANC AXSOS FT 4X80 (Screw) IMPLANT
SCREW CORTICAL 3.5MMX44MM (Screw) IMPLANT
SCREW LOCK PANGEA 3.5X60 (Screw) IMPLANT
SCREW LOCK PANGEA 3.5X70 (Screw) IMPLANT
SOLN 0.9% NACL POUR BTL 1000ML (IV SOLUTION) ×2 IMPLANT
SOLN STERILE WATER BTL 1000 ML (IV SOLUTION) ×4 IMPLANT
SPONGE T-LAP 18X18 ~~LOC~~+RFID (SPONGE) ×2 IMPLANT
STOCKINETTE IMPERVIOUS LG (DRAPES) ×2 IMPLANT
STRIP CLOSURE SKIN 1/2X4 (GAUZE/BANDAGES/DRESSINGS) IMPLANT
SUCTION TUBE FRAZIER 10FR DISP (SUCTIONS) ×2 IMPLANT
SUT MNCRL AB 4-0 PS2 18 (SUTURE) IMPLANT
SUT MON AB 2-0 CT1 36 (SUTURE) ×2 IMPLANT
SUT VIC AB 0 CT1 18XCR BRD 8 (SUTURE) IMPLANT
SUT VIC AB 0 CT1 27XBRD ANBCTR (SUTURE) ×4 IMPLANT
SUTURE FIBERWR #2 38 T-5 BLUE (SUTURE) IMPLANT
SUTURE FIBERWR 2-0 18 17.9 3/8 (SUTURE) ×2 IMPLANT
TOWEL GREEN STERILE (TOWEL DISPOSABLE) ×4 IMPLANT
TOWEL GREEN STERILE FF (TOWEL DISPOSABLE) ×2 IMPLANT
TOWEL OR 17X24 6PK STRL BLUE (TOWEL DISPOSABLE) ×2 IMPLANT
TRAY FOLEY W/BAG SLVR 14FR (SET/KITS/TRAYS/PACK) IMPLANT
TUBE CONNECTING 12X1/4 (SUCTIONS) ×2 IMPLANT
YANKAUER SUCT BULB TIP NO VENT (SUCTIONS) ×2 IMPLANT

## 2024-04-20 NOTE — Anesthesia Procedure Notes (Signed)
 Procedure Name: Intubation Date/Time: 04/20/2024 1:40 PM  Performed by: Chaney Ozell CROME, CRNAPre-anesthesia Checklist: Patient identified, Emergency Drugs available, Suction available and Patient being monitored Patient Re-evaluated:Patient Re-evaluated prior to induction Oxygen Delivery Method: Circle System Utilized Preoxygenation: Pre-oxygenation with 100% oxygen Induction Type: IV induction Ventilation: Mask ventilation without difficulty Laryngoscope Size: Mac and 4 Grade View: Grade II Tube type: Oral Tube size: 7.5 mm Number of attempts: 1 Airway Equipment and Method: Stylet and Oral airway Placement Confirmation: ETT inserted through vocal cords under direct vision, positive ETCO2 and breath sounds checked- equal and bilateral Secured at: 23 cm Tube secured with: Tape Dental Injury: Teeth and Oropharynx as per pre-operative assessment

## 2024-04-20 NOTE — Anesthesia Procedure Notes (Signed)
 Anesthesia Regional Block: Adductor canal block   Pre-Anesthetic Checklist: , timeout performed,  Correct Patient, Correct Site, Correct Laterality,  Correct Procedure, Correct Position, site marked,  Risks and benefits discussed,  Surgical consent,  Pre-op evaluation,  At surgeon's request and post-op pain management  Laterality: Left  Prep: Maximum Sterile Barrier Precautions used, chloraprep       Needles:  Injection technique: Single-shot  Needle Type: Echogenic Needle      Needle Gauge: 20     Additional Needles:   Procedures:,,,, ultrasound used (permanent image in chart),,    Narrative:  Start time: 04/20/2024 12:20 PM End time: 04/20/2024 12:23 PM Injection made incrementally with aspirations every 5 mL.  Performed by: Personally  Anesthesiologist: Keneth Lynwood POUR, MD

## 2024-04-20 NOTE — Transfer of Care (Signed)
 Immediate Anesthesia Transfer of Care Note  Patient: Derrick Best  Procedure(s) Performed: OPEN REDUCTION INTERNAL FIXATION (ORIF) TIBIA FRACTURE (Left) REPAIR, MENISCUS, KNEE (Left: Knee)  Patient Location: PACU  Anesthesia Type:General and Regional  Level of Consciousness: awake, alert , and oriented  Airway & Oxygen Therapy: Patient Spontanous Breathing  Post-op Assessment: Report given to RN and Post -op Vital signs reviewed and stable  Post vital signs: Reviewed and stable  Last Vitals:  Vitals Value Taken Time  BP 132/91 04/20/24 15:21  Temp 36.7 C 04/20/24 15:21  Pulse 96 04/20/24 15:30  Resp 16 04/20/24 15:30  SpO2 91 % 04/20/24 15:30  Vitals shown include unfiled device data.  Last Pain:  Vitals:   04/20/24 1220  TempSrc:   PainSc: 4       Patients Stated Pain Goal: 3 (04/20/24 1147)  Complications: No notable events documented.

## 2024-04-20 NOTE — Discharge Instructions (Addendum)

## 2024-04-20 NOTE — Anesthesia Preprocedure Evaluation (Signed)
 Anesthesia Evaluation  Patient identified by MRN, date of birth, ID band Patient awake    Reviewed: Allergy & Precautions, NPO status , Patient's Chart, lab work & pertinent test results, reviewed documented beta blocker date and time   Airway Mallampati: III  TM Distance: >3 FB     Dental no notable dental hx.    Pulmonary neg COPD, former smoker   breath sounds clear to auscultation       Cardiovascular (-) CAD, (-) Past MI, (-) Cardiac Stents and (-) CABG  Rhythm:Regular Rate:Normal     Neuro/Psych neg Seizures PSYCHIATRIC DISORDERS Anxiety        GI/Hepatic ,neg GERD  ,,(+) neg Cirrhosis        Endo/Other    Renal/GU Renal disease     Musculoskeletal   Abdominal   Peds  Hematology   Anesthesia Other Findings   Reproductive/Obstetrics                              Anesthesia Physical Anesthesia Plan  ASA: 2  Anesthesia Plan: General   Post-op Pain Management:    Induction: Intravenous  PONV Risk Score and Plan: 2 and Ondansetron  and Dexamethasone   Airway Management Planned: Oral ETT  Additional Equipment:   Intra-op Plan:   Post-operative Plan: Extubation in OR  Informed Consent: I have reviewed the patients History and Physical, chart, labs and discussed the procedure including the risks, benefits and alternatives for the proposed anesthesia with the patient or authorized representative who has indicated his/her understanding and acceptance.     Dental advisory given  Plan Discussed with: CRNA  Anesthesia Plan Comments:          Anesthesia Quick Evaluation

## 2024-04-20 NOTE — Progress Notes (Signed)
 No pre-op labs needed per Dr. Keneth.  Joesph LOISE Stake, RN

## 2024-04-20 NOTE — Op Note (Signed)
 04/20/2024  3:05 PM  PATIENT:  Derrick Best    PRE-OPERATIVE DIAGNOSIS:  LEFT UNICONDYLAR TIBIA PLATEAU FRACTURE  POST-OPERATIVE DIAGNOSIS:  Same  PROCEDURE:  OPEN REDUCTION INTERNAL FIXATION (ORIF) TIBIA FRACTURE, REPAIR, MENISCUS, KNEE  SURGEON:  [name], MD  ASSISTANT: Gerard Large, PA-C, he was present and scrubbed throughout the case, critical for completion in a timely fashion, and for retraction, instrumentation, and closure.   ANESTHESIA:   General Plus block  PREOPERATIVE INDICATIONS:  [name] is a  48 y.o. male with a diagnosis of LEFT UNICONDYLAR TIBIA PLATEAU FRACTURE who failed conservative measures and elected for surgical management.    The risks benefits and alternatives were discussed with the patient preoperatively including but not limited to the risks of infection, bleeding, nerve injury, cardiopulmonary complications, the need for revision surgery, among others, and the patient was willing to proceed.  OPERATIVE IMPLANTS: Stryker lateral plateau plate FiberWire stitches  OPERATIVE FINDINGS: Lateral meniscus tear at the capsular junction noted  Dye punch lateral plateau with medial split bicondylar plateau fracture  BLOOD LOSS: 40  COMPLICATIONS: None  TOURNIQUET TIME: 90 minutes  OPERATIVE PROCEDURE:  Patient was identified in the preoperative holding area and site was marked by me He was transported to the operating theater and placed on the table in supine position taking care to pad all bony prominences. After a preincinduction time out anesthesia was induced. The left lower extremity was prepped and draped in normal sterile fashion and a pre-incision timeout was performed. He received Ancef  for preoperative antibiotics.   I made a curvilinear curvilinear incision over the lateral tibial plateau I dissected down to the anterior compartment as well as the IT band  I made an anterior compartment fasciotomy to allow for swelling  I  elevated the IT band off of Gertie's tubercle  Next I made a submeniscal arthrotomy visualize the dye punch lateral plateau fracture the medial side was nondisplaced  I noted a lateral capsular meniscus tear  I used a 2-0 FiberWire stitch to repair the lateral meniscus to its capsule  Next I used a tamp and through the anterior split I was able to elevate the dye punch portion of the lateral tibial plateau into an anatomic position directly visualized through the arthrotomy  I took for x-rays of the tibia I reviewed these myself and noted excellent reduction of the lateral plateau no displacement of the medial plateau  Next I pinned a plate into place and was happy with the position of this I placed a nonlocking screw initially followed by locking screws proximally and nonlocking screws distally  This plate stabilized both the lateral plateau as well as the medial plateau fracture  I took 4 x-rays of the knee and was happy with all reduction and hardware placement based on my read of these x-rays  Thorough irrigation of the knee joint and incision closure of the IT band and superior portion of the anterior compartment I left the fasciotomy open for swelling.  Complex closure of the skin then performed sterile dressing applied  POST OPERATIVE PLAN: Nonweightbearing knee immobilizer full-time

## 2024-04-20 NOTE — Interval H&P Note (Signed)
 History and Physical Interval Note:  04/20/2024 11:05 AM  Derrick Best  has presented today for surgery, with the diagnosis of LEFT UNICONDYLAR TIBIA PLATEAU FRACTURE.  The various methods of treatment have been discussed with the patient and family. After consideration of risks, benefits and other options for treatment, the patient has consented to  Procedures: OPEN REDUCTION INTERNAL FIXATION (ORIF) TIBIA FRACTURE (Left) REPAIR, MENISCUS, KNEE (Left) as a surgical intervention.  The patient's history has been reviewed, patient examined, no change in status, stable for surgery.  I have reviewed the patient's chart and labs.  Questions were answered to the patient's satisfaction.     [name]

## 2024-04-21 ENCOUNTER — Encounter (HOSPITAL_COMMUNITY): Payer: Self-pay | Admitting: Orthopedic Surgery

## 2024-04-21 NOTE — Anesthesia Postprocedure Evaluation (Signed)
 Anesthesia Post Note  Patient: Derrick Best  Procedure(s) Performed: OPEN REDUCTION INTERNAL FIXATION (ORIF) TIBIA FRACTURE (Left) REPAIR, MENISCUS, KNEE (Left: Knee)     Anesthesia Type: Regional and General Anesthetic complications: no   No notable events documented.  Last Vitals:  Vitals:   04/20/24 1545 04/20/24 1600  BP: (!) 135/94 (!) 135/94  Pulse: 88 91  Resp: 11 10  Temp:  36.7 C  SpO2: 93% 93%    Last Pain:  Vitals:   04/20/24 1220  TempSrc:   PainSc: 4    Pain Goal: Patients Stated Pain Goal: 3 (04/20/24 1147)                 Geneviene Tesch

## 2024-05-12 ENCOUNTER — Encounter (HOSPITAL_COMMUNITY): Payer: Self-pay | Admitting: Orthopedic Surgery
